# Patient Record
Sex: Female | Born: 1985 | Race: White | Hispanic: No | Marital: Married | Smoking: Current every day smoker
Health system: Southern US, Community
[De-identification: ages and names within clinical notes are randomized; demographics above are authoritative.]

---

## 2014-12-04 ENCOUNTER — Emergency Department (HOSPITAL_COMMUNITY)
Admission: EM | Admit: 2014-12-04 | Discharge: 2014-12-05 | Disposition: A | Discharge status: Home / Self Care | Payer: Self-pay | Attending: Emergency Medicine | Admitting: Emergency Medicine

## 2014-12-04 ENCOUNTER — Emergency Department (HOSPITAL_COMMUNITY): Payer: Self-pay

## 2014-12-04 ENCOUNTER — Encounter (HOSPITAL_COMMUNITY): Payer: Self-pay | Admitting: Emergency Medicine

## 2014-12-04 DIAGNOSIS — M79604 Pain in right leg: Secondary | ICD-10-CM | POA: Insufficient documentation

## 2014-12-04 DIAGNOSIS — Z79899 Other long term (current) drug therapy: Secondary | ICD-10-CM | POA: Insufficient documentation

## 2014-12-04 DIAGNOSIS — R079 Chest pain, unspecified: Secondary | ICD-10-CM | POA: Insufficient documentation

## 2014-12-04 DIAGNOSIS — R0602 Shortness of breath: Secondary | ICD-10-CM | POA: Insufficient documentation

## 2014-12-04 DIAGNOSIS — Z720 Tobacco use: Secondary | ICD-10-CM | POA: Insufficient documentation

## 2014-12-04 LAB — CBC
HCT: 43.4 % (ref 36.0–46.0)
HEMOGLOBIN: 14.7 g/dL (ref 12.0–15.0)
MCH: 30.2 pg (ref 26.0–34.0)
MCHC: 33.9 g/dL (ref 30.0–36.0)
MCV: 89.3 fL (ref 78.0–100.0)
Platelets: 266 10*3/uL (ref 150–400)
RBC: 4.86 MIL/uL (ref 3.87–5.11)
RDW: 13.9 % (ref 11.5–15.5)
WBC: 14.3 10*3/uL — AB (ref 4.0–10.5)

## 2014-12-04 LAB — I-STAT TROPONIN, ED: TROPONIN I, POC: 0 ng/mL (ref 0.00–0.08)

## 2014-12-04 LAB — COMPREHENSIVE METABOLIC PANEL
ALBUMIN: 4 g/dL (ref 3.5–5.2)
ALT: 19 U/L (ref 0–35)
ANION GAP: 8 (ref 5–15)
AST: 19 U/L (ref 0–37)
Alkaline Phosphatase: 88 U/L (ref 39–117)
BILIRUBIN TOTAL: 0.5 mg/dL (ref 0.3–1.2)
BUN: 10 mg/dL (ref 6–23)
CALCIUM: 9.5 mg/dL (ref 8.4–10.5)
CHLORIDE: 101 mmol/L (ref 96–112)
CO2: 27 mmol/L (ref 19–32)
Creatinine, Ser: 0.7 mg/dL (ref 0.50–1.10)
GFR calc Af Amer: >90 mL/min (ref >90)
GFR calc non Af Amer: >90 mL/min (ref >90)
Glucose, Bld: 113 mg/dL — ABNORMAL HIGH (ref 70–99)
Potassium: 4 mmol/L (ref 3.5–5.1)
Sodium: 136 mmol/L (ref 135–145)
Total Protein: 7.2 g/dL (ref 6.0–8.3)

## 2014-12-04 LAB — D-DIMER, QUANTITATIVE (NOT AT ARMC): D DIMER QUANT: 0.38 ug{FEU}/mL (ref 0.00–0.48)

## 2014-12-04 MED ORDER — OXYCODONE-ACETAMINOPHEN 5-325 MG PO TABS
1.0000 | ORAL_TABLET | Freq: Once | ORAL | Status: AC
  Administered 2014-12-04: 1 via ORAL
  Filled 2014-12-04: qty 1

## 2014-12-04 MED ORDER — ASPIRIN 81 MG PO CHEW
324.0000 mg | CHEWABLE_TABLET | Freq: Once | ORAL | Status: AC
  Administered 2014-12-04: 324 mg via ORAL
  Filled 2014-12-04: qty 4

## 2014-12-04 NOTE — ED Notes (Signed)
Pt presents with left sided chest pain that radiates into left shoulder onset this morning, admits to shortness of breath earlier in the day but not at present.  Pt developed pain in right calf one hour ago.  Denies recent travel.

## 2014-12-04 NOTE — ED Provider Notes (Signed)
CSN: [id number]     Arrival date & time 12/04/14  2125 History   First MD Initiated Contact with Patient 12/04/14 2203     Chief Complaint  Patient presents with  . Chest Pain  . Leg Pain     (Consider location/radiation/quality/duration/timing/severity/associated sxs/prior Treatment) Patient is a 29 y.o. female presenting with chest pain and leg pain. The history is provided by the patient and medical records.  Chest Pain Associated symptoms: shortness of breath   Leg Pain   This is a 29 y.o. F with no significant PMH presenting to the ED for chest pain and SOB.  Patient states pain began this morning and has been persistent throughout the day today. She states it begins underneath her left breast and radiates into her left shoulder. She denies any radiation into neck or left arm. She states she has some shortness of breath earlier in the day but that resolved. She denies any nausea, vomiting, diaphoresis, dizziness, or lightheadedness. Patient has no known cardiac history. She does have family history of stroke and coronary artery disease. She is a daily smoker, approximately half pack per day. Patient states while she was checking into the emergency room she developed some heaviness in her right leg. She denies numbness or weakness. She denies any noted swelling or erythema. No recent travel or exogenous estrogens.  No fever, chills, sweats.  VSS on arrival.  History reviewed. No pertinent past medical history. History reviewed. No pertinent past surgical history. No family history on file. History  Substance Use Topics  . Smoking status: Current Every Day Smoker  . Smokeless tobacco: Not on file  . Alcohol Use: No   OB History    No data available     Review of Systems  Respiratory: Positive for shortness of breath.   Cardiovascular: Positive for chest pain.  All other systems reviewed and are negative.     Allergies  Review of patient's allergies indicates no known  allergies.  Home Medications   Prior to Admission medications   Medication Sig Start Date End Date Taking? Authorizing Provider  Multiple Vitamin (MULTIVITAMIN WITH MINERALS) TABS tablet Take 1 tablet by mouth daily.   Yes Historical Provider, MD   BP 114/55 mmHg  Pulse 95  Temp(Src) 98.5 F (36.9 C) (Oral)  Resp 18  SpO2 98%  LMP 11/25/2014   Physical Exam  Constitutional: She is oriented to person, place, and time. She appears well-developed and well-nourished.  HENT:  Head: Normocephalic and atraumatic.  Mouth/Throat: Oropharynx is clear and moist.  Eyes: Conjunctivae and EOM are normal. Pupils are equal, round, and reactive to light.  Neck: Normal range of motion.  Cardiovascular: Normal rate, regular rhythm and normal heart sounds.   Pulmonary/Chest: Effort normal and breath sounds normal.  Some tenderness of left anterior chest wall, no bony deformities, lungs clear bilaterally  Abdominal: Soft. Bowel sounds are normal.  Musculoskeletal: Normal range of motion.  No calf asymmetry, tenderness, or palpable cords No overlying erythema or warmth to touch DP pulses intact bilaterally  Neurological: She is alert and oriented to person, place, and time.  Skin: Skin is warm and dry.  Psychiatric: She has a normal mood and affect.  Nursing note and vitals reviewed.   ED Course  Procedures (including critical care time) Labs Review Labs Reviewed  CBC - Abnormal; Notable for the following:    WBC 14.3 (*)    All other components within normal limits  COMPREHENSIVE METABOLIC PANEL - Abnormal; Notable  for the following:    Glucose, Bld 113 (*)    All other components within normal limits  D-DIMER, QUANTITATIVE  I-STAT TROPOININ, ED    Imaging Review Dg Chest 2 View  12/04/2014   CLINICAL DATA:  Left-sided chest pain, shortness of breath for 1 day  EXAM: CHEST  2 VIEW  COMPARISON:  None.  FINDINGS: The heart size and mediastinal contours are within normal limits. Both  lungs are clear. The visualized skeletal structures are unremarkable.  IMPRESSION: No active cardiopulmonary disease.   Electronically Signed   By: Elige Ko   On: 12/04/2014 22:08     EKG Interpretation   Date/Time:  Tuesday December 04 2014 21:35:40 EST Ventricular Rate:  87 PR Interval:  142 QRS Duration: 80 QT Interval:  350 QTC Calculation: 421 R Axis:   72 Text Interpretation:  Normal sinus rhythm Normal ECG No old tracing to  compare Confirmed by [facility]  MD, DAVID ([id number]) on 12/04/2014 9:36:15 PM      MDM   Final diagnoses:  Chest pain, unspecified chest pain type   29 year old female with left-sided chest pain with radiation into left shoulder. She did have shortness of breath earlier but denies this currently. Patient has no known cardiac history, positive family history. EKG sinus rhythm without acute ischemic changes. Lab work including troponin and d-dimer negative. Chest x-ray is clear.  At this time, low suspicion for ACS, PE, dissection, or other acute cardiac event. Heart score of 2, PERC negative.  Feel patient can be discharged home with close OP follow-up.  Discussed plan with patient, he/she acknowledged understanding and agreed with plan of care.  Return precautions given for new or worsening symptoms.  Garlon Hatchet, PA-C 12/05/14 0025  Jerelyn Scott, MD 12/05/14 [phone]

## 2014-12-05 NOTE — Discharge Instructions (Signed)
Your work-up today was normal. Monitor symptoms at home, if return or develop new symptoms that are concerning please return to the ED.

## 2016-03-24 ENCOUNTER — Encounter (HOSPITAL_COMMUNITY): Payer: Self-pay | Admitting: Adult Health

## 2016-03-24 DIAGNOSIS — F17200 Nicotine dependence, unspecified, uncomplicated: Secondary | ICD-10-CM | POA: Insufficient documentation

## 2016-03-24 DIAGNOSIS — J020 Streptococcal pharyngitis: Secondary | ICD-10-CM | POA: Insufficient documentation

## 2016-03-24 LAB — RAPID STREP SCREEN (MED CTR MEBANE ONLY): STREPTOCOCCUS, GROUP A SCREEN (DIRECT): POSITIVE — AB

## 2016-03-24 MED ORDER — ACETAMINOPHEN 325 MG PO TABS: 650.0000 mg | ORAL_TABLET | Freq: Once | ORAL | Status: DC | PRN

## 2016-03-24 MED ORDER — ACETAMINOPHEN 160 MG/5ML PO SOLN
ORAL | Status: AC
  Filled 2016-03-24: qty 20.3

## 2016-03-24 MED ORDER — ACETAMINOPHEN 160 MG/5ML PO SOLN
650.0000 mg | Freq: Once | ORAL | Status: AC
  Administered 2016-03-24: 650 mg via ORAL

## 2016-03-24 NOTE — ED Notes (Signed)
Presents with sore throat, fever of 101.1 and headache began yesterday. Throat red and tonsils enlarged with white patches noted.

## 2016-03-25 ENCOUNTER — Emergency Department (HOSPITAL_COMMUNITY)
Admission: EM | Admit: 2016-03-25 | Discharge: 2016-03-25 | Disposition: A | Discharge status: Home / Self Care | Payer: Self-pay | Attending: Emergency Medicine | Admitting: Emergency Medicine

## 2016-03-25 DIAGNOSIS — J020 Streptococcal pharyngitis: Secondary | ICD-10-CM

## 2016-03-25 MED ORDER — GI COCKTAIL ~~LOC~~
30.0000 mL | Freq: Once | ORAL | Status: AC
  Administered 2016-03-25: 30 mL via ORAL
  Filled 2016-03-25: qty 30

## 2016-03-25 MED ORDER — IBUPROFEN 100 MG/5ML PO SUSP: 600.0000 mg | Freq: Four times a day (QID) | ORAL | Status: DC | PRN

## 2016-03-25 MED ORDER — HYDROCODONE-ACETAMINOPHEN 7.5-325 MG/15ML PO SOLN: 15.0000 mL | Freq: Three times a day (TID) | ORAL | Status: DC | PRN

## 2016-03-25 MED ORDER — PENICILLIN G BENZATHINE 1200000 UNIT/2ML IM SUSP
1.2000 10*6.[IU] | Freq: Once | INTRAMUSCULAR | Status: AC
  Administered 2016-03-25: 1.2 10*6.[IU] via INTRAMUSCULAR
  Filled 2016-03-25: qty 2

## 2016-03-25 MED ORDER — KETOROLAC TROMETHAMINE 60 MG/2ML IM SOLN
60.0000 mg | Freq: Once | INTRAMUSCULAR | Status: AC
  Administered 2016-03-25: 60 mg via INTRAMUSCULAR
  Filled 2016-03-25: qty 2

## 2016-03-25 NOTE — Discharge Instructions (Signed)
Take ibuprofen as prescribed for pain or fever and Hycet as needed for persistent pain. Do not take Tylenol/acetaminophen with Hycet as there is already Tylenol in this medication. Also, do not drive or drink alcohol when taking Hycet as it may make you drowsy. Use salt water gargles 3-4 times per day. You may also use over the counter Chloraseptic spray if desired. Be sure to drink plenty of fluids to prevent dehydration. Follow-up with your primary care doctor. Return to the emergency department for worsening symptoms.  Strep Throat Strep throat is a bacterial infection of the throat. Your health care provider may call the infection tonsillitis or pharyngitis, depending on whether there is swelling in the tonsils or at the back of the throat. Strep throat is most common during the cold months of the year in children who are 105-30 years of age, but it can happen during any season in people of any age. This infection is spread from person to person (contagious) through coughing, sneezing, or close contact. CAUSES Strep throat is caused by the bacteria called Streptococcus pyogenes. RISK FACTORS This condition is more likely to develop in:  People who spend time in crowded places where the infection can spread easily.  People who have close contact with someone who has strep throat. SYMPTOMS Symptoms of this condition include:  Fever or chills.   Redness, swelling, or pain in the tonsils or throat.  Pain or difficulty when swallowing.  White or yellow spots on the tonsils or throat.  Swollen, tender glands in the neck or under the jaw.  Red rash all over the body (rare). DIAGNOSIS This condition is diagnosed by performing a rapid strep test or by taking a swab of your throat (throat culture test). Results from a rapid strep test are usually ready in a few minutes, but throat culture test results are available after one or two days. TREATMENT This condition is treated with antibiotic  medicine. HOME CARE INSTRUCTIONS Medicines  Take over-the-counter and prescription medicines only as told by your health care provider.  Take your antibiotic as told by your health care provider. Do not stop taking the antibiotic even if you start to feel better.  Have family members who also have a sore throat or fever tested for strep throat. They may need antibiotics if they have the strep infection. Eating and Drinking  Do not share food, drinking cups, or personal items that could cause the infection to spread to other people.  If swallowing is difficult, try eating soft foods until your sore throat feels better.  Drink enough fluid to keep your urine clear or pale yellow. General Instructions  Gargle with a salt-water mixture 3-4 times per day or as needed. To make a salt-water mixture, completely dissolve -1 tsp of salt in 1 cup of warm water.  Make sure that all household members wash their hands well.  Get plenty of rest.  Stay home from school or work until you have been taking antibiotics for 24 hours.  Keep all follow-up visits as told by your health care provider. This is important. SEEK MEDICAL CARE IF:  The glands in your neck continue to get bigger.  You develop a rash, cough, or earache.  You cough up a thick liquid that is green, yellow-brown, or bloody.  You have pain or discomfort that does not get better with medicine.  Your problems seem to be getting worse rather than better.  You have a fever. SEEK IMMEDIATE MEDICAL CARE IF:  You have new symptoms, such as vomiting, severe headache, stiff or painful neck, chest pain, or shortness of breath.  You have severe throat pain, drooling, or changes in your voice.  You have swelling of the neck, or the skin on the neck becomes red and tender.  You have signs of dehydration, such as fatigue, dry mouth, and decreased urination.  You become increasingly sleepy, or you cannot wake up completely.  Your  joints become red or painful.   This information is not intended to replace advice given to you by your health care provider. Make sure you discuss any questions you have with your health care provider.   Document Released: 09/11/2000 Document Revised: 06/05/2015 Document Reviewed: 01/07/2015 Elsevier Interactive Patient Education [employer]2016 [employer]

## 2016-03-25 NOTE — ED Provider Notes (Signed)
CSN: [id number]     Arrival date & time 03/24/16  2137 History   First MD Initiated Contact with Patient 03/25/16 0026     Chief Complaint  Patient presents with  . Sore Throat     (Consider location/radiation/quality/duration/timing/severity/associated sxs/prior Treatment) HPI Comments: [age] year old female presents to the emergency department for evaluation of sore throat which began yesterday. Symptoms have been constant and gradually worsening. She reports a fever prior to arrival of 101.49F. This has also been associated with fatigue and mild, global headache. Patient states that she has 4 children, but denies any of them being sick. She has had no other sick contacts. No inability to swallow or drooling. She states that she has tolerated half of a bottle of [employer] while in the waiting room, trying to remain well hydrated.  Patient is a 30 y.o. female presenting with pharyngitis. The history is provided by the patient. No language interpreter was used.  Sore Throat This is a new problem. The current episode started yesterday. The problem occurs constantly. The problem has been gradually worsening. Associated symptoms include fatigue, a fever (101.49F prior to arrival), a sore throat and swollen glands. Pertinent negatives include no coughing, rash or vomiting. The symptoms are aggravated by swallowing. Treatments tried: OTC antipyretics. The treatment provided mild relief.    History reviewed. No pertinent past medical history. History reviewed. No pertinent past surgical history. History reviewed. No pertinent family history. Social History  Substance Use Topics  . Smoking status: Current Every Day Smoker  . Smokeless tobacco: None  . Alcohol Use: No   OB History    No data available      Review of Systems  Constitutional: Positive for fever (101.49F prior to arrival) and fatigue.  HENT: Positive for sore throat and trouble swallowing (secondary to pain). Negative for  drooling.   Respiratory: Negative for cough.   Gastrointestinal: Negative for vomiting.  Skin: Negative for rash.  Ten systems reviewed and are negative for acute change, except as noted in the HPI.    Allergies  Review of patient's allergies indicates no known allergies.  Home Medications   Prior to Admission medications   Medication Sig Start Date End Date Taking? Authorizing Provider  HYDROcodone-acetaminophen (HYCET) 7.5-325 mg/15 ml solution Take 15 mLs by mouth every 8 (eight) hours as needed for moderate pain. 03/25/16   Antony MaduraKelly Zayne Draheim, PA-C  ibuprofen (CHILDRENS IBUPROFEN) 100 MG/5ML suspension Take 30 mLs (600 mg total) by mouth every 6 (six) hours as needed for fever, mild pain or moderate pain. 03/25/16   Antony MaduraKelly Leonia Heatherly, PA-C  Multiple Vitamin (MULTIVITAMIN WITH MINERALS) TABS tablet Take 1 tablet by mouth daily.    Historical Provider, MD   BP 115/68 mmHg  Pulse 94  Temp(Src) 98.4 F (36.9 C) (Oral)  Resp 18  Ht 5\' 9"  (1.753 m)  Wt 102.258 kg  BMI 33.28 kg/m2  SpO2 98%   Physical Exam  Constitutional: She is oriented to person, place, and time. She appears well-developed and well-nourished. No distress.  Nontoxic appearing  HENT:  Head: Normocephalic and atraumatic.  Right Ear: Tympanic membrane, external ear and ear canal normal.  Left Ear: Tympanic membrane, external ear and ear canal normal.  Mouth/Throat: Uvula is midline and mucous membranes are normal. No trismus in the jaw. Oropharyngeal exudate and posterior oropharyngeal erythema present.  Posterior oropharyngeal erythema with exudates. Tonsils enlarged bilaterally. Uvula midline. Patient tolerating secretions without difficulty or drooling. No tripoding.  Eyes: Conjunctivae and EOM are normal.  No scleral icterus.  Neck: Normal range of motion.  No nuchal rigidity or meningismus  Cardiovascular: Regular rhythm and intact distal pulses.   Mild tachycardia. Improved compared to initial triage.  Pulmonary/Chest:  Effort normal. No stridor. No respiratory distress.  Respirations even and unlabored. No stridor.  Musculoskeletal: Normal range of motion.  Lymphadenopathy:    She has cervical adenopathy.  Neurological: She is alert and oriented to person, place, and time. She exhibits normal muscle tone. Coordination normal.  Skin: Skin is warm and dry. No rash noted. She is not diaphoretic. No erythema. No pallor.  Psychiatric: She has a normal mood and affect. Her behavior is normal.  Nursing note and vitals reviewed.   ED Course  Procedures (including critical care time) Labs Review Labs Reviewed  RAPID STREP SCREEN (NOT AT [facility]) - Abnormal; Notable for the following:    Streptococcus, Group A Screen (Direct) POSITIVE (*)    All other components within normal limits    Imaging Review No results found.   I have personally reviewed and evaluated these images and lab results as part of my medical decision-making.   EKG Interpretation None      MDM   Final diagnoses:  Strep throat    Pt mildly febrile with tonsillar exudate, cervical lymphadenopathy, and dysphagia; diagnosis of strep. Treated in the ED with NSAIDs, GI cocktail for pain as patient is driving home, and PCN IM. Presentation not concerning for PTA or infxn spread to soft tissue. No trismus or uvula deviation. Specific return precautions discussed. Patient tolerating secretions without difficulty. No trismus or tripoding. Recommended PCP follow up. Return precautions discussed and provided. Patient discharged in satisfactory condition with no unaddressed concerns.   Filed Vitals:   03/24/16 2147 03/25/16 0047  BP: 113/79 115/68  Pulse: 127 94  Temp: 100.3 F (37.9 C) 98.4 F (36.9 C)  TempSrc: Oral Oral  Resp: 20 18  Height: 5\' 9"  (1.753 m)   Weight: 102.258 kg   SpO2: 98%      Antony MaduraKelly Essie Lagunes, PA-C 03/25/16 [id number]  Alvira MondayErin Schlossman, MD 03/29/16 [phone]

## 2017-11-09 ENCOUNTER — Emergency Department (HOSPITAL_COMMUNITY): Payer: Self-pay

## 2017-11-09 ENCOUNTER — Encounter (HOSPITAL_COMMUNITY): Payer: Self-pay | Admitting: Emergency Medicine

## 2017-11-09 ENCOUNTER — Other Ambulatory Visit: Payer: Self-pay

## 2017-11-09 DIAGNOSIS — Z5321 Procedure and treatment not carried out due to patient leaving prior to being seen by health care provider: Secondary | ICD-10-CM | POA: Insufficient documentation

## 2017-11-09 DIAGNOSIS — R079 Chest pain, unspecified: Secondary | ICD-10-CM | POA: Insufficient documentation

## 2017-11-09 LAB — CBC
HEMATOCRIT: 46.1 % — AB (ref 36.0–46.0)
HEMOGLOBIN: 15.8 g/dL — AB (ref 12.0–15.0)
MCH: 31.3 pg (ref 26.0–34.0)
MCHC: 34.3 g/dL (ref 30.0–36.0)
MCV: 91.5 fL (ref 78.0–100.0)
Platelets: 272 10*3/uL (ref 150–400)
RBC: 5.04 MIL/uL (ref 3.87–5.11)
RDW: 13.6 % (ref 11.5–15.5)
WBC: 17.3 10*3/uL — ABNORMAL HIGH (ref 4.0–10.5)

## 2017-11-09 LAB — I-STAT TROPONIN, ED: Troponin i, poc: 0.02 ng/mL (ref 0.00–0.08)

## 2017-11-09 LAB — BASIC METABOLIC PANEL
ANION GAP: 10 (ref 5–15)
BUN: 8 mg/dL (ref 6–20)
CHLORIDE: 102 mmol/L (ref 101–111)
CO2: 25 mmol/L (ref 22–32)
Calcium: 9.2 mg/dL (ref 8.9–10.3)
Creatinine, Ser: 0.78 mg/dL (ref 0.44–1.00)
GFR calc Af Amer: >60 mL/min (ref >60)
GFR calc non Af Amer: >60 mL/min (ref >60)
Glucose, Bld: 128 mg/dL — ABNORMAL HIGH (ref 65–99)
Potassium: 3.6 mmol/L (ref 3.5–5.1)
Sodium: 137 mmol/L (ref 135–145)

## 2017-11-09 LAB — I-STAT BETA HCG BLOOD, ED (MC, WL, AP ONLY): I-stat hCG, quantitative: <5 m[IU]/mL (ref <5)

## 2017-11-09 LAB — HCG, QUANTITATIVE, PREGNANCY: hCG, Beta Chain, Quant, S: <1 m[IU]/mL (ref <5)

## 2017-11-09 NOTE — ED Triage Notes (Signed)
Pt st's she woke up with mid to left chest pain this am.  Also slight shortness of breath.  Pt denies cough or cold symptoms

## 2017-11-09 NOTE — ED Notes (Signed)
Writer called for reassessing vitals, no response 

## 2017-11-10 ENCOUNTER — Emergency Department (HOSPITAL_COMMUNITY)
Admission: EM | Admit: 2017-11-10 | Discharge: 2017-11-10 | Disposition: A | Discharge status: Home / Self Care | Payer: Self-pay | Attending: Emergency Medicine | Admitting: Emergency Medicine

## 2017-11-10 NOTE — ED Notes (Signed)
No answer for room 

## 2017-11-10 NOTE — ED Notes (Signed)
11/10/2017 Called listed phone number for follow-up . No answer.

## 2019-06-28 IMAGING — DX DG CHEST 2V
2 series · 2 of 2 positions shown · non-contrast
Comparison: 12/04/2014

CLINICAL DATA: Chest pain

EXAM:
CHEST  2 VIEW

[chest pa]
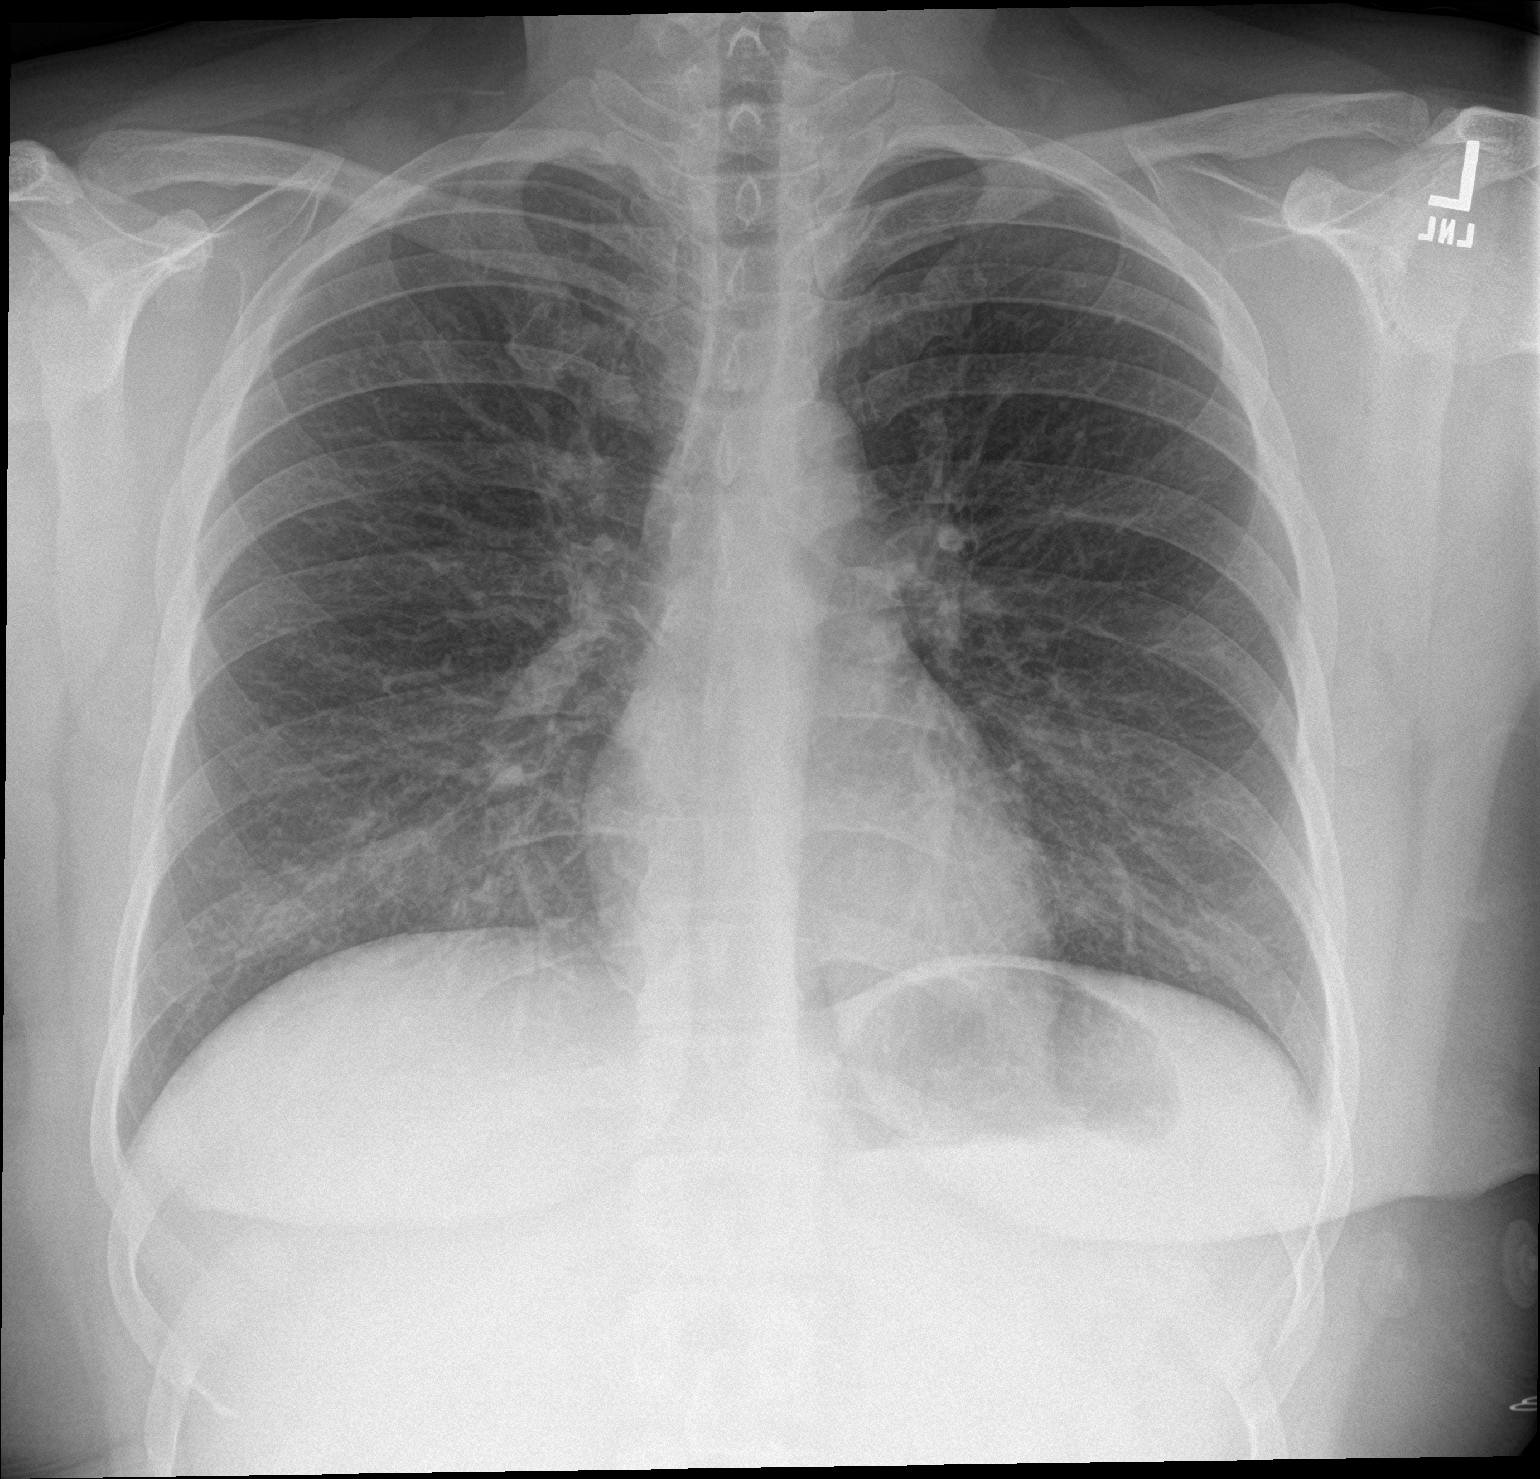

[chest lat]
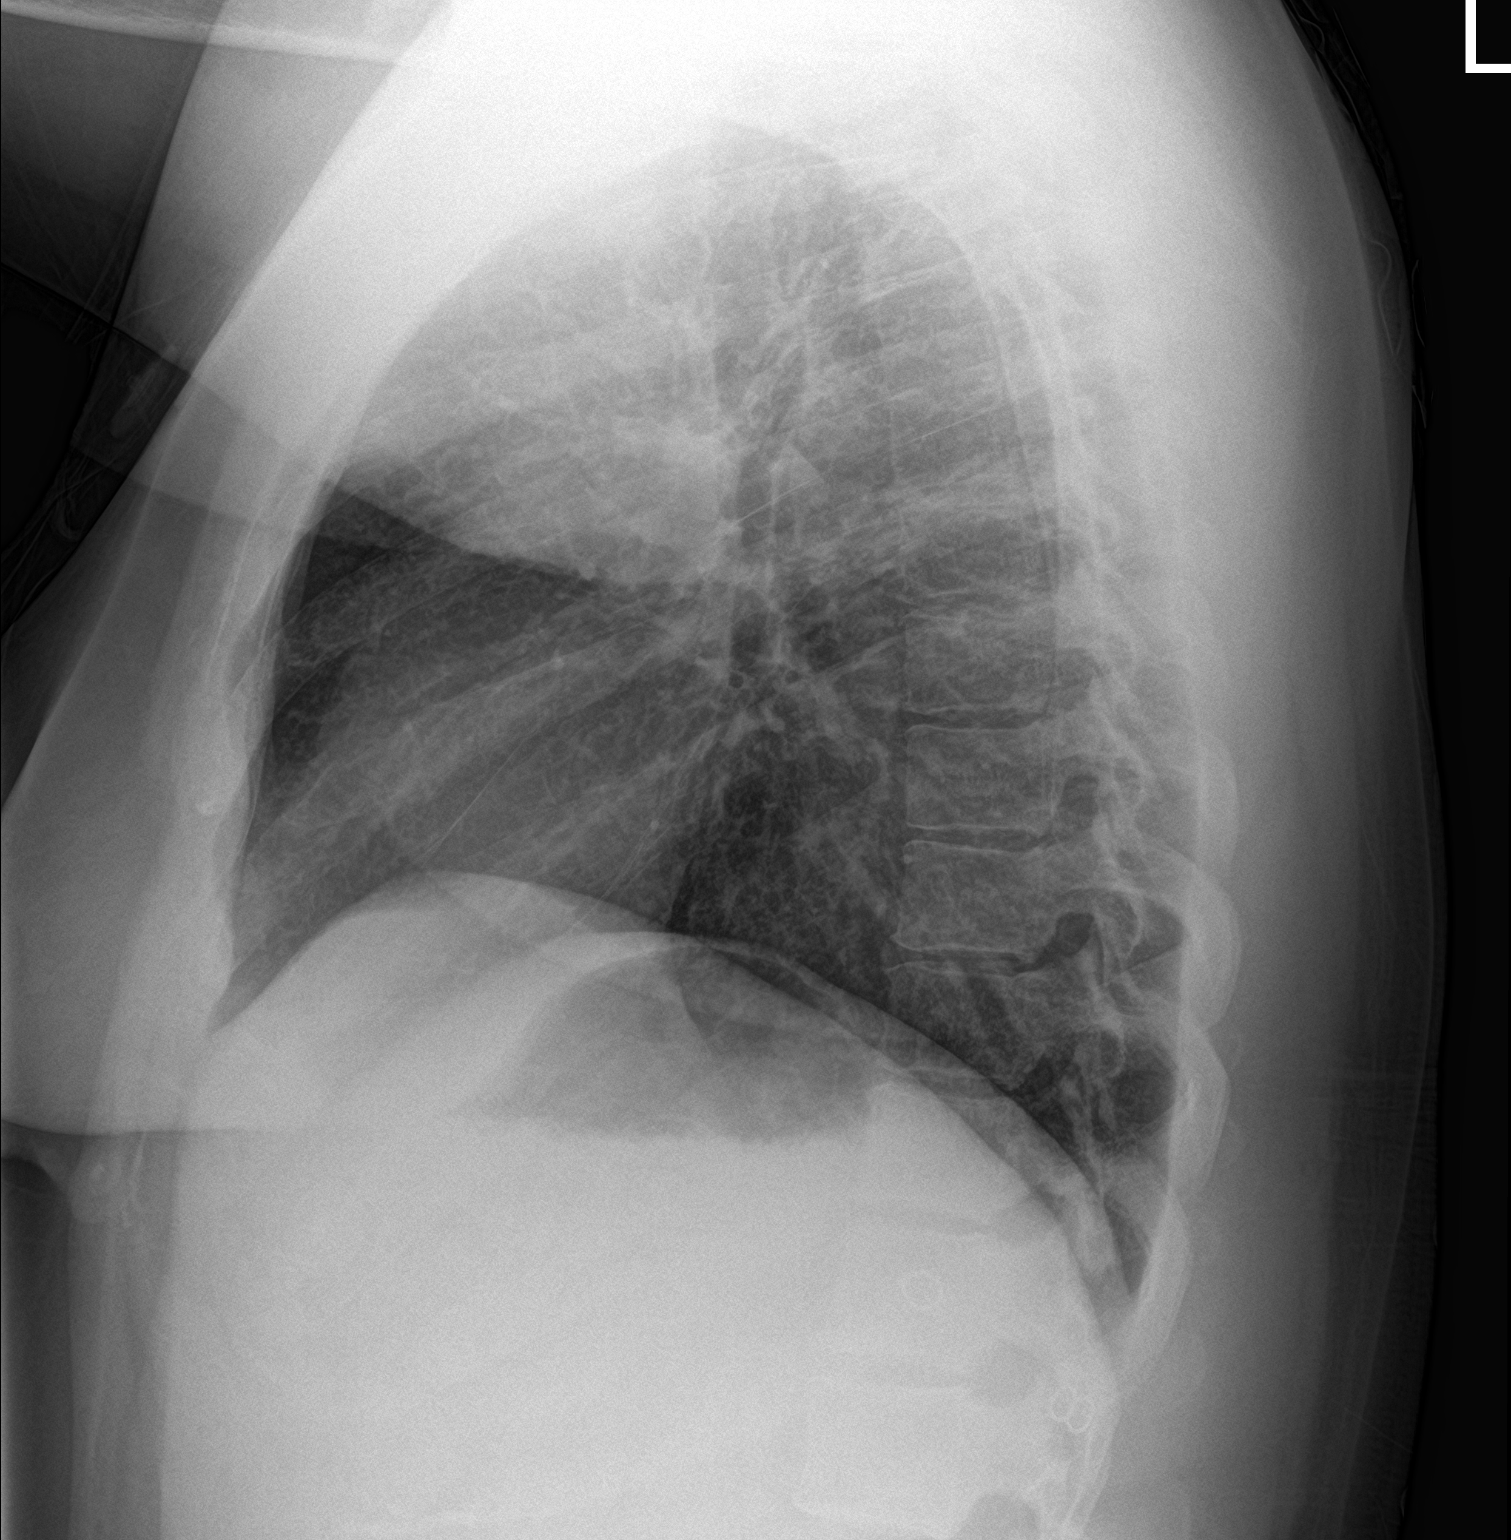

[2 of 2 positions shown; findings below may reference images not displayed]

FINDINGS: The heart size and mediastinal contours are within normal limits.
Both lungs are clear. The visualized skeletal structures are
unremarkable.
IMPRESSION: No active cardiopulmonary disease.

## 2022-04-22 ENCOUNTER — Encounter (HOSPITAL_COMMUNITY): Payer: Self-pay | Admitting: *Deleted

## 2022-04-22 ENCOUNTER — Other Ambulatory Visit: Payer: Self-pay

## 2022-04-22 ENCOUNTER — Emergency Department (HOSPITAL_COMMUNITY): Payer: BC Managed Care – PPO

## 2022-04-22 ENCOUNTER — Emergency Department (HOSPITAL_COMMUNITY)
Admission: EM | Admit: 2022-04-22 | Discharge: 2022-04-22 | Disposition: A | Discharge status: Home / Self Care | Payer: BC Managed Care – PPO | Attending: Emergency Medicine | Admitting: Emergency Medicine

## 2022-04-22 DIAGNOSIS — Z23 Encounter for immunization: Secondary | ICD-10-CM | POA: Diagnosis not present

## 2022-04-22 DIAGNOSIS — S99921A Unspecified injury of right foot, initial encounter: Secondary | ICD-10-CM | POA: Diagnosis present

## 2022-04-22 DIAGNOSIS — S91121A Laceration with foreign body of right great toe without damage to nail, initial encounter: Secondary | ICD-10-CM | POA: Diagnosis not present

## 2022-04-22 DIAGNOSIS — Y93E1 Activity, personal bathing and showering: Secondary | ICD-10-CM | POA: Diagnosis not present

## 2022-04-22 DIAGNOSIS — W228XXA Striking against or struck by other objects, initial encounter: Secondary | ICD-10-CM | POA: Diagnosis not present

## 2022-04-22 MED ORDER — CEPHALEXIN 250 MG PO CAPS
500.0000 mg | ORAL_CAPSULE | Freq: Once | ORAL | Status: AC
  Administered 2022-04-22: 500 mg via ORAL
  Filled 2022-04-22: qty 2

## 2022-04-22 MED ORDER — LIDOCAINE HCL 2 % IJ SOLN
20.0000 mL | Freq: Once | INTRAMUSCULAR | Status: AC
  Administered 2022-04-22: 400 mg via INTRADERMAL
  Filled 2022-04-22: qty 20

## 2022-04-22 MED ORDER — CEPHALEXIN 500 MG PO CAPS: 500.0000 mg | ORAL_CAPSULE | Freq: Four times a day (QID) | ORAL | 0 refills | Status: AC

## 2022-04-22 MED ORDER — TETANUS-DIPHTH-ACELL PERTUSSIS 5-2.5-18.5 LF-MCG/0.5 IM SUSY
0.5000 mL | PREFILLED_SYRINGE | Freq: Once | INTRAMUSCULAR | Status: AC
  Administered 2022-04-22: 0.5 mL via INTRAMUSCULAR
  Filled 2022-04-22: qty 0.5

## 2022-04-22 NOTE — ED Provider Notes (Signed)
[facility] EMERGENCY DEPARTMENT Provider Note   CSN: [id number] Arrival date & time: 04/22/22  0008     History  Chief Complaint  Patient presents with   Foot Injury    Micaila Ziemba is a 36 y.o. female.  Patient presents to the emergency department today for evaluation of right foot laceration sustained approximately 11 PM yesterday.  Patient states that she sliced the base of the great toe on a bed rail.  She states that she tried to rinse the toe in the shower, but it stung so she stopped.  No other treatments prior to arrival.  She reports decreased sensation on the underside of the toe that occurred with injury. No DM.        Home Medications Prior to Admission medications   Medication Sig Start Date End Date Taking? Authorizing Provider  HYDROcodone-acetaminophen (HYCET) 7.5-325 mg/15 ml solution Take 15 mLs by mouth every 8 (eight) hours as needed for moderate pain. Patient not taking: Reported on 11/09/2017 03/25/16   Antony Madura, PA-C  ibuprofen (ADVIL,MOTRIN) 200 MG tablet Take 200-800 mg by mouth every 6 (six) hours as needed (for pain, headaches, or body aches).    [provider]  ibuprofen (CHILDRENS IBUPROFEN) 100 MG/5ML suspension Take 30 mLs (600 mg total) by mouth every 6 (six) hours as needed for fever, mild pain or moderate pain. Patient not taking: Reported on 11/09/2017 03/25/16   Antony Madura, PA-C      Allergies    Patient has no known allergies.    Review of Systems   Review of Systems  Physical Exam Updated Vital Signs BP 101/64 (BP Location: Left Arm)   Pulse 63   Temp 98 F (36.7 C) (Oral)   Resp 16   SpO2 96%  Physical Exam Vitals and nursing note reviewed.  Constitutional:      Appearance: She is well-developed.  HENT:     Head: Normocephalic and atraumatic.  Eyes:     Pupils: Pupils are equal, round, and reactive to light.  Cardiovascular:     Pulses: Normal pulses. No decreased pulses.   Musculoskeletal:        General: Tenderness present.     Cervical back: Normal range of motion and neck supple.     Comments: 3cm laceration, deep, hemostatic, volarly at the MTP crease of the right great toe but extending onto the medial aspect of the great toe.  Wound base is contaminated with several flecks of grassy material.  She is able to minimally flex the toe at the IP and MTP joint, but weak.  Capillary refill in the soft tissues appears to be normal.  Patient has worn toenail polish.  Sensation is diminished distal to the laceration.  Patient had much more discomfort with injection of anesthetic proximally compared to distally.  Sensation intact at the tip of the toe.  Patient also has a skin avulsion volarly of the foot just proximal to the second and third toes.  Normal strength and sensation in toes 2 through 5.  Skin:    General: Skin is warm and dry.  Neurological:     Mental Status: She is alert.     Sensory: No sensory deficit.     Comments: Motor, sensation, and vascular distal to the injury is fully intact.   Psychiatric:        Mood and Affect: Mood normal.     ED Results / Procedures / Treatments   Labs (all labs ordered are  listed, but only abnormal results are displayed) Labs Reviewed - No data to display  EKG None  Radiology DG Foot Complete Right  Result Date: 04/22/2022 CLINICAL DATA:  Foot injury/laceration EXAM: RIGHT FOOT COMPLETE - 3+ VIEW COMPARISON:  None Available. FINDINGS: No fracture or dislocation is seen. The joint spaces are preserved. Visualized soft tissues are within normal limits. Small plantar and posterior calcaneal enthesophytes. IMPRESSION: Negative. Electronically Signed   By: Charline Bills M.D.   On: 04/22/2022 01:03    Procedures .Marland KitchenLaceration Repair  Date/Time: 04/22/2022 9:07 AM  Performed by: Renne Crigler, PA-C Authorized by: Renne Crigler, PA-C   Consent:    Consent obtained:  Verbal   Consent given by:  Patient    Risks, benefits, and alternatives were discussed: yes     Risks discussed:  Infection, pain, retained foreign body, need for additional repair, tendon damage, nerve damage, poor wound healing and vascular damage   Alternatives discussed:  No treatment Universal protocol:    Imaging studies available: yes     Patient identity confirmed:  Verbally with patient, arm band and provided demographic data Anesthesia:    Anesthesia method:  Local infiltration   Local anesthetic:  Lidocaine 2% w/o epi Laceration details:    Location:  Toe   Toe location:  R big toe   Length (cm):  3 Pre-procedure details:    Preparation:  Patient was prepped and draped in usual sterile fashion and imaging obtained to evaluate for foreign bodies Exploration:    Hemostasis achieved with:  Direct pressure   Imaging obtained: x-ray     Imaging outcome: foreign body not noted     Wound exploration: wound explored through full range of motion and entire depth of wound visualized     Wound extent: foreign bodies/material and nerve damage  Injury: grassy material. Tendon damage: visualization difficult, cannot r/o partial tendon disruption.   Foreign bodies/material:  Pharmacologist   Contaminated: yes   Treatment:    Area cleansed with:  Saline   Amount of cleaning:  Extensive   Irrigation solution:  Sterile saline   Irrigation volume:  1000cc   Irrigation method:  Pressure wash (using pressure bottle cap on 1000cc NS bottle)   Visualized foreign bodies/material removed: yes     Debridement:  Minimal Skin repair:    Repair method:  Sutures   Suture size:  4-0   Suture material:  Nylon   Suture technique:  Simple interrupted   Number of sutures:  7 Approximation:    Approximation:  Loose Repair type:    Repair type:  Intermediate Post-procedure details:    Dressing:  Sterile dressing   Procedure completion:  Tolerated well, no immediate complications Comments:     This was a deep wound.  Location makes  visualization of the base of the wound difficult.  She does have some weakness in flexion and I cannot rule out partial tendon injury.  Suspect cutaneous nerve injury given numbness distal to the wound.  The wound had multiple grassy pieces of material/contamination.  I was able to either rinse out saline or directly remove all pieces of debris that I could see.  Prior to wound closure, discussed with patient that she does have a higher risk of retained foreign bodies and therefore higher infection risk with closure.  She agrees to proceed given that wound washed as well as possible.  Mild amount of debridement of nonviable tissue.  Wound was closed without complications.  We discussed that  given the depth of the wound, she likely has associated nerve injury causing decreased sensation in the toe.  Discussed variable healing with these types of injuries, potential for residual numbness or pain as a result of her injury.  Patient will be placed on oral antibiotics and given podiatry follow-up.  She will be given a postop shoe.  Offered crutches, she declines stating that she would likely fall trying to use them.     Medications Ordered in ED Medications  cephALEXin (KEFLEX) capsule 500 mg (has no administration in time range)  lidocaine (XYLOCAINE) 2 % (with pres) injection 400 mg (400 mg Intradermal Given 04/22/22 0808)  Tdap (BOOSTRIX) injection 0.5 mL (0.5 mLs Intramuscular Given 04/22/22 0808)    ED Course/ Medical Decision Making/ A&P   Patient seen and examined. History obtained directly from patient. Work-up including labs, imaging, EKG ordered in triage, if performed, were reviewed.    Labs/EKG: None ordered  Imaging: Independently visualized and interpreted.  This included: X-ray of the right foot, agree no fracture  Medications/Fluids: Ordered: Lidocaine without epinephrine, Tdap  Most recent vital signs reviewed and are as follows: BP 101/64 (BP Location: Left Arm)   Pulse 63   Temp  98 F (36.7 C) (Oral)   Resp 16   SpO2 96%   Initial impression: Laceration, possible cutaneous nerve injury given decreased sensation  9:12 AM Reassessment performed. Patient appears stable. Wound cleaned extensively, explored, and repaired per note.  Prescriptions written for:   Other home care instructions discussed: Use of postop shoe, RICE protocol and OTC meds for pain, prophylactic antibiotics, need for podiatry follow-up.  ED return instructions discussed: Patient counseled on wound care. Patient was urged to return to the Emergency Department urgently with worsening pain, swelling, expanding erythema especially if it streaks away from the affected area, fever, or if they have any other concerns. Patient verbalized understanding.   Follow-up instructions discussed: Patient counseled on need to see podiatry for reevaluation in 7 days.                           Medical Decision Making Amount and/or Complexity of Data Reviewed Radiology: ordered.  Risk Prescription drug management.   Patient with deep, somewhat contaminated wound of right great toe.  X-ray negative.  Wound closure considered and performed as above.  Suspect cutaneous nerve injury.  Possible partial flexor tendon injury.  Wound copiously irrigated.  Tetanus updated.  Antibiotics initiated.  Recommend outpatient podiatry follow-up.        Final Clinical Impression(s) / ED Diagnoses Final diagnoses:  Laceration of right great toe with foreign body without damage to nail, initial encounter    Rx / DC Orders ED Discharge Orders          Ordered    cephALEXin (KEFLEX) 500 MG capsule  4 times daily        04/22/22 0859              Renne Crigler, PA-C 04/22/22 0915    Gloris Manchester, MD 04/27/22 [phone]

## 2022-04-22 NOTE — Progress Notes (Signed)
Orthopedic Tech Progress Note Patient Details:  Gloria Velazquez 1986-06-24 [id number]  Ortho Devices Type of Ortho Device: Postop shoe/boot Ortho Device/Splint Location: RLE Ortho Device/Splint Interventions: Ordered, Application, Adjustment   Post Interventions Patient Tolerated: Well Instructions Provided: Care of device  Donald Pore 04/22/2022, 9:11 AM

## 2022-04-22 NOTE — Discharge Instructions (Addendum)
Please read and follow all provided instructions.  Your diagnoses today include:  1. Laceration of right great toe with foreign body without damage to nail, initial encounter    Tests performed today include: X-ray of the affected area that did not show any foreign bodies or broken bones Vital signs. See below for your results today.   Medications prescribed:  Keflex (cephalexin) - antibiotic  You have been prescribed an antibiotic medicine: take the entire course of medicine even if you are feeling better. Stopping early can cause the antibiotic not to work.  Take any prescribed medications only as directed.   Home care instructions:  Follow any educational materials and wound care instructions contained in this packet.   Use postop shoe until cleared by podiatrist.  Keep affected area above the level of your heart when possible to minimize swelling. Wash area gently twice a day with warm soapy water. Do not apply alcohol or hydrogen peroxide. Cover the area if it draining or weeping.   Follow-up instructions: Patient follow-up with the podiatrist in 1 week for wound recheck.  This is especially important as we discussed given contamination of your wound and it is at a higher risk for infection.  In addition, there is the possibility of a tendon injury and/or nerve injury and you will need to have the function and sensation of your toe rechecked.  Return instructions:  Return to the Emergency Department if you have: Fever Worsening pain Worsening swelling of the wound Pus draining from the wound Redness of the skin that moves away from the wound, especially if it streaks away from the affected area  Any other emergent concerns  Your vital signs today were: BP 101/64 (BP Location: Left Arm)   Pulse 63   Temp 98 F (36.7 C) (Oral)   Resp 16   SpO2 96%  If your blood pressure (BP) was elevated above 135/85 this visit, please have this repeated by your doctor within one  month. --------------

## 2022-04-22 NOTE — ED Triage Notes (Signed)
Pt says she was moving mattresses, lost her balance and stepped on a metal bedrail. She has a laceration under the right great toe (plantar surface at the base). C/o pain and swelling through her foot. Unknown tetanus.

## 2022-05-05 ENCOUNTER — Ambulatory Visit: Payer: BC Managed Care – PPO | Admitting: Podiatry

## 2022-05-05 DIAGNOSIS — S91111A Laceration without foreign body of right great toe without damage to nail, initial encounter: Secondary | ICD-10-CM

## 2022-05-12 NOTE — Progress Notes (Signed)
Subjective:   Patient ID: Gloria Velazquez, female   DOB: 36 y.o.   MRN: [id number]   HPI Chief Complaint  Patient presents with   Foot Injury    Patient came in today for a right hallux injury, pt fell on bed frame and cut the bottom of the toe on April 22 2022, pt went to the ER, they did X-Rays and sutured the toe, TX: Keflex, surg shoe     36 year old female presents with above complaints.  She states that she cut her right big toe on a bed frame on April 22, 2022 and she went to emergency room and x-rays were performed and she had a laceration repaired.  She presents today for evaluation of wound and possible suture removal.  She has some discomfort but improving.  She was put on Keflex originally.  No fevers or chills.  She still wearing surgical shoe.  No other concerns.   Review of Systems  All other systems reviewed and are negative.  No past medical history on file.  No past surgical history on file.   Current Outpatient Medications:    cephALEXin (KEFLEX) 500 MG capsule, Take 1 capsule (500 mg total) by mouth 4 (four) times daily., Disp: 28 capsule, Rfl: 0   ibuprofen (ADVIL,MOTRIN) 200 MG tablet, Take 200-800 mg by mouth every 6 (six) hours as needed (for pain, headaches, or body aches)., Disp: , Rfl:   No Known Allergies         Objective:  Physical Exam  General: AAO x3, NAD  Dermatological: Ulceration of the plantar aspect of the hallux going towards the lateral aspect.  The sutures are no longer intact more on the lateral aspect and was pulled through the incision the incision appears to be healing.  There is mild edema there is no erythema or warmth    Vascular: Dorsalis Pedis artery and Posterior Tibial artery pedal pulses are 2/4 bilateral with immedate capillary fill time. There is no pain with calf compression, swelling, warmth, erythema.   Neruologic: Grossly intact via light touch bilateral.   Musculoskeletal: Mild tenderness palpation.  Flexor tendon  appears to be intact clinically but difficult to fully evaluate given some guarding from pain.  Muscular strength 5/5 in all groups tested bilateral.  Gait: Unassisted, Nonantalgic.       Assessment:   36 year old female laceration     Plan:  -Treatment options discussed including all alternatives, risks, and complications. -Etiology of symptoms were discussed -ER records reviewed. -I reviewed the sutures out any complications.  Discussed small antibiotic ointment dressing changes daily.  Remain in the surgical shoe for now until the incision is healed completely. -Discussed possible tendon laceration.  I do think the tendon is intact.  She is able to dorsiflex and plantarflex her toe.  Strength is somewhat decreased but also due to guarding.  I will see her back in a couple weeks and there is clinical concern for flexor injury we will order an MRI but for now want the incision to heal.  Vivi Barrack DPM

## 2022-05-19 ENCOUNTER — Ambulatory Visit: Payer: BC Managed Care – PPO | Admitting: Podiatry

## 2023-02-26 ENCOUNTER — Other Ambulatory Visit: Payer: Self-pay | Admitting: Physician Assistant

## 2023-02-26 DIAGNOSIS — G4452 New daily persistent headache (NDPH): Secondary | ICD-10-CM

## 2023-04-16 ENCOUNTER — Other Ambulatory Visit: Payer: Self-pay

## 2023-06-10 ENCOUNTER — Inpatient Hospital Stay: Payer: BC Managed Care – PPO | Attending: Hematology and Oncology | Admitting: Hematology and Oncology

## 2023-06-10 ENCOUNTER — Inpatient Hospital Stay: Payer: BC Managed Care – PPO

## 2023-06-10 DIAGNOSIS — D72820 Lymphocytosis (symptomatic): Secondary | ICD-10-CM | POA: Insufficient documentation

## 2023-06-10 NOTE — Assessment & Plan Note (Deleted)
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

## 2024-01-21 ENCOUNTER — Encounter: Payer: Self-pay | Admitting: Physician Assistant

## 2024-01-23 ENCOUNTER — Ambulatory Visit
Admission: RE | Admit: 2024-01-23 | Discharge: 2024-01-23 | Disposition: A | Discharge status: Home / Self Care | Source: Ambulatory Visit | Attending: Physician Assistant | Admitting: Physician Assistant

## 2024-01-23 DIAGNOSIS — G4452 New daily persistent headache (NDPH): Secondary | ICD-10-CM

## 2024-01-23 MED ORDER — GADOPICLENOL 0.5 MMOL/ML IV SOLN
9.5000 mL | Freq: Once | INTRAVENOUS | Status: AC | PRN
  Administered 2024-01-23: 9.5 mL via INTRAVENOUS

## 2024-07-25 ENCOUNTER — Ambulatory Visit: Admitting: Family Medicine

## 2024-07-25 NOTE — Progress Notes (Deleted)
 New Patient Visit  Subjective:     Patient ID: Gloria Velazquez, female    DOB: June 07, 1986, 38 y.o.   MRN: [id number]  No chief complaint on file.   HPI  Discussed the use of AI scribe software for clinical note transcription with the patient, who gave verbal consent to proceed.  History of Present Illness      ROS Per HPI  Outpatient Encounter Medications as of 07/25/2024  Medication Sig   cephALEXin  (KEFLEX ) 500 MG capsule Take 1 capsule (500 mg total) by mouth 4 (four) times daily.   ibuprofen  (ADVIL ,MOTRIN ) 200 MG tablet Take 200-800 mg by mouth every 6 (six) hours as needed (for pain, headaches, or body aches).   No facility-administered encounter medications on file as of 07/25/2024.    No past medical history on file.  No past surgical history on file.  No family history on file.  Social History   Socioeconomic History   Marital status: Married    Spouse name: Not on file   Number of children: Not on file   Years of education: Not on file   Highest education level: Not on file  Occupational History   Not on file  Tobacco Use   Smoking status: Every Day   Smokeless tobacco: Never  Substance and Sexual Activity   Alcohol use: No   Drug use: No   Sexual activity: Not on file  Other Topics Concern   Not on file  Social History Narrative   Not on file   Social Drivers of Health   Financial Resource Strain: Not on file  Food Insecurity: Low Risk  (06/06/2024)   Received from [facility]   Hunger Vital Sign    Within the past 12 months, you worried that your food would run out before you got money to buy more: Never true    Within the past 12 months, the food you bought just didn't last and you didn't have money to get more. : Never true  Transportation Needs: No Transportation Needs (06/06/2024)   Received from [facility]    In the past 12 months, has lack of reliable transportation kept you from medical appointments, meetings,  work or from getting things needed for daily living? : No  Physical Activity: Not on file  Stress: Not on file  Social Connections: Unknown (01/28/2022)   Received from [facility]   Social Network    Social Network: Not on file  Intimate Partner Violence: Unknown (12/30/2021)   Received from [facility]   HITS    Physically Hurt: Not on file    Insult or Talk Down To: Not on file    Threaten Physical Harm: Not on file    Scream or Curse: Not on file       Objective:    There were no vitals taken for this visit.   Physical Exam Vitals and nursing note reviewed.  Constitutional:      General: She is not in acute distress.    Appearance: Normal appearance. She is normal weight.  HENT:     Head: Normocephalic and atraumatic.     Right Ear: External ear normal.     Left Ear: External ear normal.     Nose: Nose normal.     Mouth/Throat:     Mouth: Mucous membranes are moist.     Pharynx: Oropharynx is clear.  Eyes:     Extraocular Movements: Extraocular movements intact.  Pupils: Pupils are equal, round, and reactive to light.  Cardiovascular:     Rate and Rhythm: Normal rate and regular rhythm.     Pulses: Normal pulses.     Heart sounds: Normal heart sounds.  Pulmonary:     Effort: Pulmonary effort is normal. No respiratory distress.     Breath sounds: Normal breath sounds. No wheezing, rhonchi or rales.  Musculoskeletal:        General: Normal range of motion.     Cervical back: Normal range of motion.     Right lower leg: No edema.     Left lower leg: No edema.  Lymphadenopathy:     Cervical: No cervical adenopathy.  Neurological:     General: No focal deficit present.     Mental Status: She is alert and oriented to person, place, and time.  Psychiatric:        Mood and Affect: Mood normal.        Thought Content: Thought content normal.     No results found for any visits on 07/25/24.      Assessment & Plan:   Assessment and Plan Assessment &  Plan      No orders of the defined types were placed in this encounter.    No orders of the defined types were placed in this encounter.   No follow-ups on file.  Corean LITTIE Ku, FNP

## 2024-07-25 NOTE — Patient Instructions (Incomplete)
 Welcome to [facility]!  Thank you for choosing us  for your Primary Care needs.   We offer in person and video appointments for your convenience. You may call our office to schedule appointments, or you may schedule appointments with me through MyChart.   The best way to get in contact with me is via MyChart message. This will get to me faster than a phone call, unless there is an emergency, then please call 911.  The lab is located downstairs in the Sports Medicine building, we also have xray available there.
